# Patient Record
Sex: Female | Born: 1985 | Race: Asian | Hispanic: No | Marital: Married | Smoking: Never smoker
Health system: Southern US, Community
[De-identification: ages and names within clinical notes are randomized; demographics above are authoritative.]

## PROBLEM LIST (undated history)

## (undated) DIAGNOSIS — E039 Hypothyroidism, unspecified: Secondary | ICD-10-CM

## (undated) DIAGNOSIS — D649 Anemia, unspecified: Secondary | ICD-10-CM

## (undated) HISTORY — DX: Hypothyroidism, unspecified: E03.9

## (undated) HISTORY — DX: Anemia, unspecified: D64.9

---

## 2018-08-05 LAB — OB RESULTS CONSOLE ABO/RH: RH Type: POSITIVE

## 2018-08-05 LAB — OB RESULTS CONSOLE ANTIBODY SCREEN: Antibody Screen: NEGATIVE

## 2018-08-05 LAB — OB RESULTS CONSOLE HIV ANTIBODY (ROUTINE TESTING): HIV: NONREACTIVE

## 2018-08-05 LAB — OB RESULTS CONSOLE RUBELLA ANTIBODY, IGM: Rubella: IMMUNE

## 2018-08-05 LAB — OB RESULTS CONSOLE HEPATITIS B SURFACE ANTIGEN: Hepatitis B Surface Ag: NEGATIVE

## 2018-08-05 LAB — OB RESULTS CONSOLE RPR: RPR: NONREACTIVE

## 2018-08-13 ENCOUNTER — Other Ambulatory Visit (HOSPITAL_COMMUNITY): Payer: Self-pay

## 2018-08-13 ENCOUNTER — Ambulatory Visit (HOSPITAL_COMMUNITY)
Admission: RE | Admit: 2018-08-13 | Discharge: 2018-08-13 | Disposition: A | Discharge status: Home / Self Care | Payer: Managed Care, Other (non HMO) | Source: Ambulatory Visit | Attending: Obstetrics & Gynecology | Admitting: Obstetrics & Gynecology

## 2018-08-13 ENCOUNTER — Other Ambulatory Visit (HOSPITAL_COMMUNITY): Payer: Self-pay | Admitting: Obstetrics & Gynecology

## 2018-08-13 DIAGNOSIS — Z3A08 8 weeks gestation of pregnancy: Secondary | ICD-10-CM | POA: Diagnosis not present

## 2018-08-13 DIAGNOSIS — O3680X0 Pregnancy with inconclusive fetal viability, not applicable or unspecified: Secondary | ICD-10-CM

## 2018-08-26 NOTE — L&D Delivery Note (Signed)
Delivery Note At 7:56 AM a viable female was delivered via Vaginal, Spontaneous (Presentation: ROA  ).  APGAR: 9, 9; weight pending .   Placenta status: L&D .  Cord:  with the following complications: none .  Cord pH: n/a  Anesthesia:  epidural Episiotomy: None Lacerations: 2nd degree Suture Repair: 2.0 3.0 chromic Est. Blood Loss (mL):  100cc  Mom to postpartum.  Baby to Couplet care / Skin to Skin.  Annalee Genta 03/10/2019, 8:19 AM

## 2019-03-08 ENCOUNTER — Telehealth (HOSPITAL_COMMUNITY): Payer: Self-pay | Admitting: *Deleted

## 2019-03-08 NOTE — Telephone Encounter (Signed)
Preadmission screen  

## 2019-03-09 ENCOUNTER — Encounter (HOSPITAL_COMMUNITY): Payer: Self-pay | Admitting: *Deleted

## 2019-03-09 LAB — OB RESULTS CONSOLE GBS: GBS: NEGATIVE

## 2019-03-10 ENCOUNTER — Inpatient Hospital Stay (HOSPITAL_COMMUNITY)
Admission: AD | Admit: 2019-03-10 | Discharge: 2019-03-12 | DRG: 807 | Disposition: A | Discharge status: Home / Self Care | Payer: Managed Care, Other (non HMO) | Attending: Obstetrics and Gynecology | Admitting: Obstetrics and Gynecology

## 2019-03-10 ENCOUNTER — Other Ambulatory Visit: Payer: Self-pay

## 2019-03-10 ENCOUNTER — Inpatient Hospital Stay (HOSPITAL_COMMUNITY): Payer: Managed Care, Other (non HMO) | Admitting: Anesthesiology

## 2019-03-10 ENCOUNTER — Encounter (HOSPITAL_COMMUNITY): Payer: Self-pay

## 2019-03-10 DIAGNOSIS — E039 Hypothyroidism, unspecified: Secondary | ICD-10-CM | POA: Diagnosis present

## 2019-03-10 DIAGNOSIS — D509 Iron deficiency anemia, unspecified: Secondary | ICD-10-CM | POA: Diagnosis present

## 2019-03-10 DIAGNOSIS — O9902 Anemia complicating childbirth: Secondary | ICD-10-CM | POA: Diagnosis present

## 2019-03-10 DIAGNOSIS — Z3A39 39 weeks gestation of pregnancy: Secondary | ICD-10-CM

## 2019-03-10 DIAGNOSIS — Z1159 Encounter for screening for other viral diseases: Secondary | ICD-10-CM | POA: Diagnosis not present

## 2019-03-10 DIAGNOSIS — O26893 Other specified pregnancy related conditions, third trimester: Secondary | ICD-10-CM | POA: Diagnosis present

## 2019-03-10 DIAGNOSIS — O99284 Endocrine, nutritional and metabolic diseases complicating childbirth: Principal | ICD-10-CM | POA: Diagnosis present

## 2019-03-10 LAB — CBC
HCT: 39.1 % (ref 36.0–46.0)
Hemoglobin: 12.4 g/dL (ref 12.0–15.0)
MCH: 25.9 pg — ABNORMAL LOW (ref 26.0–34.0)
MCHC: 31.7 g/dL (ref 30.0–36.0)
MCV: 81.8 fL (ref 80.0–100.0)
Platelets: 204 10*3/uL (ref 150–400)
RBC: 4.78 MIL/uL (ref 3.87–5.11)
RDW: 14.8 % (ref 11.5–15.5)
WBC: 11.4 10*3/uL — ABNORMAL HIGH (ref 4.0–10.5)
nRBC: 0 % (ref 0.0–0.2)

## 2019-03-10 LAB — TYPE AND SCREEN
ABO/RH(D): B POS
Antibody Screen: NEGATIVE

## 2019-03-10 LAB — ABO/RH: ABO/RH(D): B POS

## 2019-03-10 LAB — RPR: RPR Ser Ql: NONREACTIVE

## 2019-03-10 LAB — SARS CORONAVIRUS 2 BY RT PCR (HOSPITAL ORDER, PERFORMED IN ~~LOC~~ HOSPITAL LAB): SARS Coronavirus 2: NEGATIVE

## 2019-03-10 MED ORDER — SODIUM CHLORIDE (PF) 0.9 % IJ SOLN
INTRAMUSCULAR | Status: DC | PRN
  Administered 2019-03-10: 12 mL/h via EPIDURAL

## 2019-03-10 MED ORDER — EPHEDRINE 5 MG/ML INJ: 10.0000 mg | INTRAVENOUS | Status: DC | PRN

## 2019-03-10 MED ORDER — ZOLPIDEM TARTRATE 5 MG PO TABS: 5.0000 mg | ORAL_TABLET | Freq: Every evening | ORAL | Status: DC | PRN

## 2019-03-10 MED ORDER — LACTATED RINGERS IV SOLN
INTRAVENOUS | Status: DC
  Administered 2019-03-10 (×2): via INTRAVENOUS

## 2019-03-10 MED ORDER — ONDANSETRON HCL 4 MG PO TABS: 4.0000 mg | ORAL_TABLET | ORAL | Status: DC | PRN

## 2019-03-10 MED ORDER — LEVOTHYROXINE SODIUM 50 MCG PO TABS
50.0000 ug | ORAL_TABLET | Freq: Every day | ORAL | Status: DC
  Administered 2019-03-10 – 2019-03-11 (×2): 50 ug via ORAL
  Filled 2019-03-10 (×3): qty 1

## 2019-03-10 MED ORDER — FLEET ENEMA 7-19 GM/118ML RE ENEM: 1.0000 | ENEMA | RECTAL | Status: DC | PRN

## 2019-03-10 MED ORDER — SOD CITRATE-CITRIC ACID 500-334 MG/5ML PO SOLN: 30.0000 mL | ORAL | Status: DC | PRN

## 2019-03-10 MED ORDER — LACTATED RINGERS IV SOLN
500.0000 mL | Freq: Once | INTRAVENOUS | Status: AC
  Administered 2019-03-10: 500 mL via INTRAVENOUS

## 2019-03-10 MED ORDER — IBUPROFEN 600 MG PO TABS
600.0000 mg | ORAL_TABLET | Freq: Four times a day (QID) | ORAL | Status: DC
  Filled 2019-03-10 (×5): qty 1

## 2019-03-10 MED ORDER — OXYTOCIN BOLUS FROM INFUSION
500.0000 mL | Freq: Once | INTRAVENOUS | Status: AC
  Administered 2019-03-10: 500 mL via INTRAVENOUS

## 2019-03-10 MED ORDER — LIDOCAINE HCL (PF) 1 % IJ SOLN
30.0000 mL | INTRAMUSCULAR | Status: AC | PRN
  Administered 2019-03-10: 30 mL via SUBCUTANEOUS
  Filled 2019-03-10: qty 30

## 2019-03-10 MED ORDER — ACETAMINOPHEN 325 MG PO TABS: 650.0000 mg | ORAL_TABLET | ORAL | Status: DC | PRN

## 2019-03-10 MED ORDER — OXYTOCIN 40 UNITS IN NORMAL SALINE INFUSION - SIMPLE MED
2.5000 [IU]/h | INTRAVENOUS | Status: DC
  Filled 2019-03-10: qty 1000

## 2019-03-10 MED ORDER — DIPHENHYDRAMINE HCL 25 MG PO CAPS: 25.0000 mg | ORAL_CAPSULE | Freq: Four times a day (QID) | ORAL | Status: DC | PRN

## 2019-03-10 MED ORDER — PRENATAL MULTIVITAMIN CH
1.0000 | ORAL_TABLET | Freq: Every day | ORAL | Status: DC
  Administered 2019-03-10 – 2019-03-12 (×3): 1 via ORAL
  Filled 2019-03-10 (×3): qty 1

## 2019-03-10 MED ORDER — ONDANSETRON HCL 4 MG/2ML IJ SOLN: 4.0000 mg | INTRAMUSCULAR | Status: DC | PRN

## 2019-03-10 MED ORDER — OXYCODONE-ACETAMINOPHEN 5-325 MG PO TABS: 1.0000 | ORAL_TABLET | ORAL | Status: DC | PRN

## 2019-03-10 MED ORDER — DIPHENHYDRAMINE HCL 50 MG/ML IJ SOLN: 12.5000 mg | INTRAMUSCULAR | Status: DC | PRN

## 2019-03-10 MED ORDER — PHENYLEPHRINE 40 MCG/ML (10ML) SYRINGE FOR IV PUSH (FOR BLOOD PRESSURE SUPPORT)
PREFILLED_SYRINGE | INTRAVENOUS | Status: AC
  Filled 2019-03-10: qty 10

## 2019-03-10 MED ORDER — BENZOCAINE-MENTHOL 20-0.5 % EX AERO
1.0000 "application " | INHALATION_SPRAY | CUTANEOUS | Status: DC | PRN
  Administered 2019-03-10: 1 via TOPICAL
  Filled 2019-03-10: qty 56

## 2019-03-10 MED ORDER — FENTANYL-BUPIVACAINE-NACL 0.5-0.125-0.9 MG/250ML-% EP SOLN: 12.0000 mL/h | EPIDURAL | Status: DC | PRN

## 2019-03-10 MED ORDER — OXYCODONE-ACETAMINOPHEN 5-325 MG PO TABS: 2.0000 | ORAL_TABLET | ORAL | Status: DC | PRN

## 2019-03-10 MED ORDER — SENNOSIDES-DOCUSATE SODIUM 8.6-50 MG PO TABS
2.0000 | ORAL_TABLET | ORAL | Status: DC
  Filled 2019-03-10: qty 2

## 2019-03-10 MED ORDER — DIBUCAINE (PERIANAL) 1 % EX OINT: 1.0000 "application " | TOPICAL_OINTMENT | CUTANEOUS | Status: DC | PRN

## 2019-03-10 MED ORDER — COCONUT OIL OIL: 1.0000 "application " | TOPICAL_OIL | Status: DC | PRN

## 2019-03-10 MED ORDER — FENTANYL-BUPIVACAINE-NACL 0.5-0.125-0.9 MG/250ML-% EP SOLN
EPIDURAL | Status: AC
  Filled 2019-03-10: qty 250

## 2019-03-10 MED ORDER — WITCH HAZEL-GLYCERIN EX PADS: 1.0000 "application " | MEDICATED_PAD | CUTANEOUS | Status: DC | PRN

## 2019-03-10 MED ORDER — PHENYLEPHRINE 40 MCG/ML (10ML) SYRINGE FOR IV PUSH (FOR BLOOD PRESSURE SUPPORT): 80.0000 ug | PREFILLED_SYRINGE | INTRAVENOUS | Status: DC | PRN

## 2019-03-10 MED ORDER — ONDANSETRON HCL 4 MG/2ML IJ SOLN: 4.0000 mg | Freq: Four times a day (QID) | INTRAMUSCULAR | Status: DC | PRN

## 2019-03-10 MED ORDER — SIMETHICONE 80 MG PO CHEW: 80.0000 mg | CHEWABLE_TABLET | ORAL | Status: DC | PRN

## 2019-03-10 MED ORDER — LIDOCAINE HCL (PF) 1 % IJ SOLN
INTRAMUSCULAR | Status: DC | PRN
  Administered 2019-03-10: 5 mL via EPIDURAL

## 2019-03-10 MED ORDER — LACTATED RINGERS IV SOLN: 500.0000 mL | INTRAVENOUS | Status: DC | PRN

## 2019-03-10 NOTE — Anesthesia Preprocedure Evaluation (Signed)
Anesthesia Evaluation  Patient identified by MRN, date of birth, ID band Patient awake    Reviewed: Allergy & Precautions, NPO status , Patient's Chart, lab work & pertinent test results  Airway Mallampati: II  TM Distance: >3 FB Neck ROM: Full    Dental no notable dental hx.    Pulmonary neg pulmonary ROS,    Pulmonary exam normal breath sounds clear to auscultation       Cardiovascular negative cardio ROS Normal cardiovascular exam Rhythm:Regular Rate:Normal     Neuro/Psych negative neurological ROS  negative psych ROS   GI/Hepatic   Endo/Other  Hypothyroidism   Renal/GU      Musculoskeletal   Abdominal   Peds  Hematology Hgb 12.4 Plt 204   Anesthesia Other Findings   Reproductive/Obstetrics (+) Pregnancy                             Anesthesia Physical Anesthesia Plan  ASA: II  Anesthesia Plan: Epidural   Post-op Pain Management:    Induction:   PONV Risk Score and Plan:   Airway Management Planned:   Additional Equipment:   Intra-op Plan:   Post-operative Plan:   Informed Consent: I have reviewed the patients History and Physical, chart, labs and discussed the procedure including the risks, benefits and alternatives for the proposed anesthesia with the patient or authorized representative who has indicated his/her understanding and acceptance.       Plan Discussed with:   Anesthesia Plan Comments:         Anesthesia Quick Evaluation

## 2019-03-10 NOTE — MAU Note (Signed)
CTX every 5 minutes. No LOF.  Some light spotting.  + FM.  3 cm on last exam.

## 2019-03-10 NOTE — Anesthesia Procedure Notes (Signed)

## 2019-03-10 NOTE — Anesthesia Postprocedure Evaluation (Signed)
Anesthesia Post Note  Patient: Kelsey Walker  Procedure(s) Performed: AN AD [place]     Patient location during evaluation: Mother Baby Anesthesia Type: Epidural Level of consciousness: awake, awake and alert and oriented Pain management: pain level controlled Vital Signs Assessment: post-procedure vital signs reviewed and stable Respiratory status: spontaneous breathing and respiratory function stable Cardiovascular status: blood pressure returned to baseline Postop Assessment: no headache, epidural receding, patient able to bend at knees, adequate PO intake, no backache, no apparent nausea or vomiting and able to ambulate Anesthetic complications: no    Last Vitals:  Vitals:   03/10/19 1015 03/10/19 1100  BP: 107/68 109/67  Pulse: 75 68  Resp: 17 17  Temp: 36.8 C 36.5 C  SpO2:      Last Pain:  Vitals:   03/10/19 1100  TempSrc: Oral  PainSc: 7    Pain Goal:                   Bufford Spikes

## 2019-03-10 NOTE — H&P (Signed)
HPI: 33 y/o G2P1001 @ [redacted]w[redacted]d estimated gestational age (as dated by LMP c/w 20 week ultrasound) presents for painful contractions.   no Leaking of Fluid,   no Vaginal Bleeding,   + Uterine Contractions,  + Fetal Movement.  Prenatal care has been provided by Dr. Nelda Marseille  ROS: no HA, no epigastric pain, no visual changes.    Pregnancy complicated by: 1) Hypothyroidism- on Levothyroxine 2mcg daily 2) Iron def.- on oral iron tablets   Prenatal Transfer Tool  Maternal Diabetes: No Genetic Screening: Normal Maternal Ultrasounds/Referrals: Normal Fetal Ultrasounds or other Referrals:  None Maternal Substance Abuse:  No Significant Maternal Medications:  Meds include: Syntroid Significant Maternal Lab Results: Group B Strep negative   PNL:  GBS negative, Rub Immune, Hep B neg, RPR NR, HIV neg, GC/C neg, glucola:normal Hgb: 11.2 Blood type: B positive, antibody neg  Immunizations: Tdap: 5/15 Flu: 12/19  OBHx: FTNSVD x 1, 1#5AX, uncomplicated PMHx:  Hypothyroidism, iron def. Meds:  PNV, iron, synthroid 2mcg daily Allergy:  No Known Allergies SurgHx: none SocHx:   no Tobacco, no  EtOH, no Illicit Drugs  O: BP 094/07   Pulse 76   Temp 97.6 F (36.4 C) (Oral)   Resp 20   Ht 5\' 1"  (1.549 m)   Wt 74.1 kg   LMP 06/05/2018   SpO2 100%   BMI 30.88 kg/m  Gen. AAOx3, NAD CV.  RRR  No murmur.  Resp. CTAB, no wheeze or crackles. Abd. Gravid,  no tenderness,  no rigidity,  no guarding Extr.  no edema B/L , no calf tenderness, neg Homan's B/L  FHT: 135 baseline, mod variability, + accels,  Occasional variable decels Toco: q 4 min SVE: 9/90/-1, AROM clear fluid   Labs: see orders  A/P:  33 y.o. G2P1001 @ [redacted]w[redacted]d EGA who presents for active labor -FWB:  NICHD Cat II FHTs- pt repositioned, FHT now Cat. I -Labor: expectant management -GBS: negative -Pain management: continue epidural -Hypothyroidism: continue synthroid 51mcg daily  Janyth Pupa, DO [phone]  (cell) [phone] (office)

## 2019-03-11 LAB — CBC
HCT: 35.2 % — ABNORMAL LOW (ref 36.0–46.0)
Hemoglobin: 11.3 g/dL — ABNORMAL LOW (ref 12.0–15.0)
MCH: 25.9 pg — ABNORMAL LOW (ref 26.0–34.0)
MCHC: 32.1 g/dL (ref 30.0–36.0)
MCV: 80.7 fL (ref 80.0–100.0)
Platelets: 184 10*3/uL (ref 150–400)
RBC: 4.36 MIL/uL (ref 3.87–5.11)
RDW: 14.7 % (ref 11.5–15.5)
WBC: 12.7 10*3/uL — ABNORMAL HIGH (ref 4.0–10.5)
nRBC: 0 % (ref 0.0–0.2)

## 2019-03-11 NOTE — Lactation Note (Signed)
This note was copied from a baby's chart. Lactation Consultation Note Baby 72 hrs old. Mom states baby hasn't been very interested in BF a lot. Newborn behavior, STS, I&O, breast massage, supply and demand. Mom has Large everted nipples. Hand expression demonstrated colostrum. Mom has a 34 yr old that she exclusively pumped and bottle fed for 1 yr. Mom states that she hopes just to BF for this baby. Asked mom to call for assistance when baby is more interested in feeding. LC wonders if baby is able to get mom's large nipple in her mouth well. Lactation brochure given   Patient Name: Kelsey Walker [id number]'[id number] Date: 03/11/2019 Reason for consult: Initial assessment;Term   Maternal Data Has patient been taught Hand Expression?: Yes Does the patient have breastfeeding experience prior to this delivery?: Yes  Feeding Feeding Type: Breast Fed  LATCH Score Latch: Repeated attempts needed to sustain latch, nipple held in mouth throughout feeding, stimulation needed to elicit sucking reflex.  Audible Swallowing: A few with stimulation  Type of Nipple: Everted at rest and after stimulation  Comfort (Breast/Nipple): Soft / non-tender  Hold (Positioning): Assistance needed to correctly position infant at breast and maintain latch.  LATCH Score: 7  Interventions Interventions: Breast feeding basics reviewed;Hand express;Breast massage  Lactation Tools Discussed/Used     Consult Status Consult Status: Follow-up Date: 03/11/19 Follow-up type: In-patient    Theodoro Kalata 03/11/2019, 1:37 AM

## 2019-03-11 NOTE — Lactation Note (Signed)
This note was copied from a baby's chart. Lactation Consultation Note  Patient Name: Kelsey Walker [id number] Date: 03/11/2019 Reason for consult: Follow-up assessment;1st time breastfeeding;Term;Infant weight loss Baby is 27 hours old / @ 22 hours Bili check 6.1  LC reviewed and updated the doc flow sheets / WNL for age  As LC entered the room baby latched on the left breast / football/ shallow latch/ and LC  Noted baby to be non-nutritive. LC with dad's help switched moms pillow to being vertical and added support Under baby and it allowed baby's mouth to open wider. Increased swallows noted and increased more with  Compressions. Baby fed for 30 mins and was very relaxed and not sucking, LC reviewed with mom how to  Take the baby off, nipple well rounded. Baby woke up, [place] offered to assist to switch baby to the right breast  Football, and reviewed basics of latching and the importance of depth at the breast. Multiple swallows noted / increased  With compressions. Baby satisfied after feeding.  Dad showed LC the large heavy wet diaper and transitional stool   Per mom feels  both breast are filling and showed LC a small fill nodule.  LC reminded mom when the baby is feeding important to massage and the knot should disappear.  LC reviewed sore nipple and engorgement prevention and tx. Mom denies sore nipples.  Per mom had engorgement issues with her 1st baby - just pumping and bottle feeding.  Mom brought her hand pump - HAAKA to the hospital and [facility] reviewed it with her.  Mom also mentioned she has a DEBP Med[place] at home.  Her F/U Pedis  - is Psychologist, forensic ( formerly [employer] ).  LC highly recommended since she just pumped for 1 year with her 1st baby to F/U with [place] in [place] office or consider F/U at [facility] if she is not available next week.  Mom and dad asked many basic breast feeding questions and will need F/U tomorrow for BF reassurance.   Maternal  Data Has patient been taught Hand Expression?: Yes  Feeding Feeding Type: Breast Fed  LATCH Score Latch: Grasps breast easily, tongue down, lips flanged, rhythmical sucking.  Audible Swallowing: Spontaneous and intermittent  Type of Nipple: Everted at rest and after stimulation  Comfort (Breast/Nipple): Soft / non-tender  Hold (Positioning): Assistance needed to correctly position infant at breast and maintain latch.  LATCH Score: 9  Interventions Interventions: Breast feeding basics reviewed;Assisted with latch;Skin to skin;Breast massage;Hand express;Breast compression;Adjust position;Support pillows;Position options  Lactation Tools Discussed/Used WIC Program: No Pump Review: Milk Storage   Consult Status Consult Status: Follow-up Date: 03/12/19 Follow-up type: In-patient    [place] 03/11/2019, 5:02 PM

## 2019-03-11 NOTE — Progress Notes (Signed)
Postpartum Note Day # 1  S:  Patient resting comfortable in bed.  Notes some pain with breastfeeding- pt states she is not taking pain medication  Tolerating general diet. + flatus, + BM.  Lochia moderate.  Ambulating without difficulty.  She denies n/v/f/c, SOB, or CP.  Pt plans on breastfeeding.  O: Temp:  [97.5 F (36.4 C)-98.3 F (36.8 C)] 98.3 F (36.8 C) (07/16 0552) Pulse Rate:  [65-103] 71 (07/16 0552) Resp:  [16-18] 18 (07/15 1926) BP: (102-127)/(59-84) 123/84 (07/16 0552) SpO2:  [99 %] 99 % (07/15 1926) Gen: A&Ox3, NAD Resp: Normal respiratory rate and effort Abdomen: soft, NT, ND Uterus: firm, non-tender, below umbilicus Ext: No edema, no calf tenderness bilaterally Labs:  Recent Labs    03/10/19 0505 03/11/19 0538  HGB 12.4 11.3*    A/P: Pt is a 33 y.o. [id number] s/p NSVD, PPD#1  - Pain well controlled -GU: voiding freely -GI: Tolerating general diet -Activity: encouraged sitting up to chair and ambulation as tolerated -Prophylaxis: early ambulation -Labs: stable as above  Continue with postpartum care, plan for discharge home, PPD#2  Janyth Pupa, DO [phone] (cell) [phone] (office)

## 2019-03-12 MED ORDER — IBUPROFEN 600 MG PO TABS: 600.0000 mg | ORAL_TABLET | Freq: Four times a day (QID) | ORAL | 0 refills | Status: AC

## 2019-03-12 NOTE — Discharge Summary (Signed)
OB Discharge Summary     Patient Name: Kelsey Walker DOB: 1986-05-09 MRN: [id number]  Date of admission: 03/10/2019 Delivering MD: Janyth Pupa   Date of discharge: 03/12/2019  Admitting diagnosis: 40WKS CTX Intrauterine pregnancy: [redacted]w[redacted]d     Secondary diagnosis:  Active Problems:   Normal labor  Additional problems: hypothyroidism     Discharge diagnosis: Term Pregnancy Delivered                                                                                                Post partum procedures:none  Augmentation: AROM  Complications: None  Hospital course:  Onset of Labor With Vaginal Delivery     33 y.o. yo G2P1001 at [redacted]w[redacted]d was admitted in Active Labor on 03/10/2019. Patient had an uncomplicated labor course as follows:  Membrane Rupture Time/Date: 7:25 AM ,03/10/2019   Intrapartum Procedures: Episiotomy: None [1]                                         Lacerations:  2nd degree [3]  Patient had a delivery of a Viable infant. 03/10/2019  Information for the patient's newborn:  Chantele, [place] Girl Sabel [[id number]]  Delivery Method: Vag-Spont     Pateint had an uncomplicated postpartum course.  She is ambulating, tolerating a regular diet, passing flatus, and urinating well. Patient is discharged home in stable condition on 03/12/19.   Physical exam  Vitals:   03/10/19 1926 03/11/19 0552 03/11/19 1721 03/11/19 2218  BP: 102/70 123/84 116/75 113/75  Pulse: 77 71 78 75  Resp: 18  17 18   Temp: 98.2 F (36.8 C) 98.3 F (36.8 C) 98.2 F (36.8 C) 97.9 F (36.6 C)  TempSrc: Oral  Oral Oral  SpO2: 99%   100%  Weight:      Height:       General: alert, cooperative and no distress Lochia: appropriate Uterine Fundus: firm Incision: N/A DVT Evaluation: No evidence of DVT seen on physical exam. Labs: Lab Results  Component Value Date   WBC 12.7 (H) 03/11/2019   HGB 11.3 (L) 03/11/2019   HCT 35.2 (L) 03/11/2019   MCV 80.7 03/11/2019   PLT 184 03/11/2019   No  flowsheet data found.  Discharge instruction: per After Visit Summary and "Baby and Me Booklet".  After visit meds:  Allergies as of 03/12/2019   No Known Allergies     Medication List    TAKE these medications   ferrous sulfate 325 (65 FE) MG tablet Take 325 mg by mouth daily with breakfast.   ibuprofen 600 MG tablet Commonly known as: ADVIL Take 1 tablet (600 mg total) by mouth every 6 (six) hours.   levothyroxine 50 MCG tablet Commonly known as: SYNTHROID Take 50 mcg by mouth daily before breakfast.   prenatal multivitamin Tabs tablet Take 1 tablet by mouth daily at 12 noon.       Diet: routine diet  Activity: Advance as tolerated. Pelvic rest for 6 weeks.   Outpatient follow up:6 weeks Follow  up Appt:No future appointments. Follow up Visit:No follow-ups on file.  Postpartum contraception: Undecided  Newborn Data: Live born female  Birth Weight: 6 lb 10.7 oz (3025 g) APGAR: 9, 9  Newborn Delivery   Birth date/time: 03/10/2019 07:56:00 Delivery type: Vaginal, Spontaneous      Baby Feeding: Breast Disposition:home with mother   03/12/2019 Sharon SellerJennifer M Ryli Standlee, DO

## 2019-03-12 NOTE — Discharge Instructions (Signed)

## 2019-03-12 NOTE — Lactation Note (Signed)
This note was copied from a baby's chart. Lactation Consultation Note  Patient Name: Kelsey Walker [id number] Date: 03/12/2019 Reason for consult: Follow-up assessment;Infant weight loss;Nipple pain/trauma Baby is 65 hours old / 7 % weight loss Bili - 9.3 at 45 hours As LC entered the room baby asleep and per mom baby last fed at 8 am for 20 mins.  While LC reviewing breast feeding D/C information baby woke up- and [place] watch mom latch the  Baby and she was allowing the baby to nibble her way on to the breast.  LC had mom take her off, and start over to latch.LC assisted to obtain depth and assisted mom  How to position her hands and breast compressions with latch. LC encouraged and recommended to mom Prior to latch - breast massage, hand express, pre- pump with her HAAKa due to soreness, and latch with breast  Compressions, reverse pressure as shown if needed.  Reviewed sore nipples and engorgement prevention and tx.  Per mom has a [place] at home.  Stressed STS feedings until baby is back to birth weight and gaining steadily, and can stay awake for majority  Of feeding.  LC highly recommended again today to mom and dad to call their [place] office and set up a [place] appt with Irine Seal, [place]  For next week for reassurance. Mom did not breast feed 1st, only pumped and bottle fed.      Maternal Data    Feeding Feeding Type: Breast Fed  LATCH Score Latch: Grasps breast easily, tongue down, lips flanged, rhythmical sucking.  Audible Swallowing: Spontaneous and intermittent  Type of Nipple: Everted at rest and after stimulation  Comfort (Breast/Nipple): Filling, red/small blisters or bruises, mild/mod discomfort  Hold (Positioning): Assistance needed to correctly position infant at breast and maintain latch.  LATCH Score: 8  Interventions Interventions: Breast feeding basics reviewed;Assisted with latch;Skin to skin;Breast massage;Hand express;Breast compression;Adjust  position;Support pillows;Position options  Lactation Tools Discussed/Used Tools: Shells;Comfort gels Shell Type: Inverted   Consult Status Consult Status: Follow-up Date: ([place] recommended to call Irine Seal and make an [place] appt for next week Monday or Tuesday) Follow-up type: [place] 03/12/2019, 10:14 AM

## 2019-03-16 ENCOUNTER — Other Ambulatory Visit (HOSPITAL_COMMUNITY)
Admission: RE | Admit: 2019-03-16 | Discharge: 2019-03-16 | Disposition: A | Discharge status: Home / Self Care | Payer: Managed Care, Other (non HMO) | Source: Ambulatory Visit

## 2019-03-18 ENCOUNTER — Inpatient Hospital Stay (HOSPITAL_COMMUNITY): Payer: Managed Care, Other (non HMO)

## 2019-03-18 ENCOUNTER — Inpatient Hospital Stay (HOSPITAL_COMMUNITY)
Admission: AD | Admit: 2019-03-18 | Payer: Managed Care, Other (non HMO) | Source: Home / Self Care | Admitting: Obstetrics & Gynecology

## 2019-09-04 IMAGING — US US OB TRANSVAGINAL
1 series · 15 of 28 positions shown · non-contrast
Comparison: None.

CLINICAL DATA: First trimester pregnancy with inconclusive fetal
viability. No detectable fetal heart tones. Gestational age by LMP
of 9 weeks 6 days.

EXAM:
TRANSVAGINAL OB ULTRASOUND
TECHNIQUE: Transvaginal ultrasound was performed for complete evaluation of the
gestation as well as the maternal uterus, adnexal regions, and
pelvic cul-de-sac.

[Series 1: us ob transvaginal · 15 of 49 slices shown]
[im 1/49]
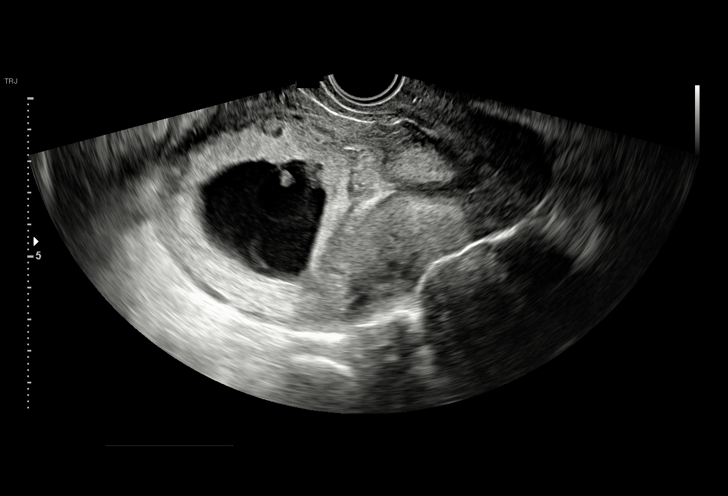
[im 4/49]
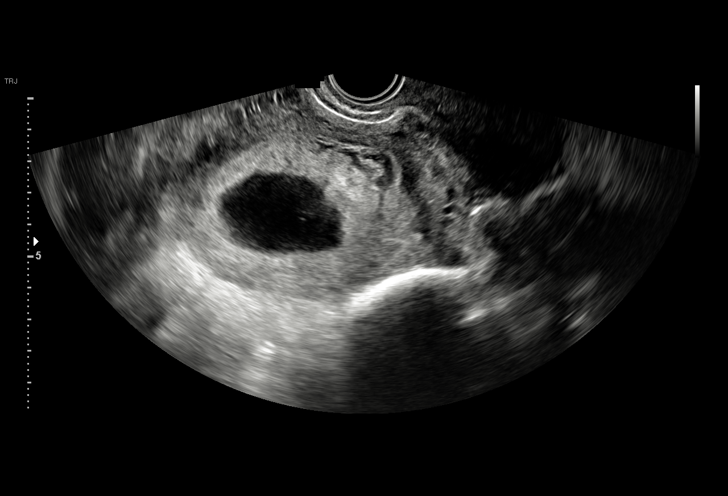
[im 8/49]
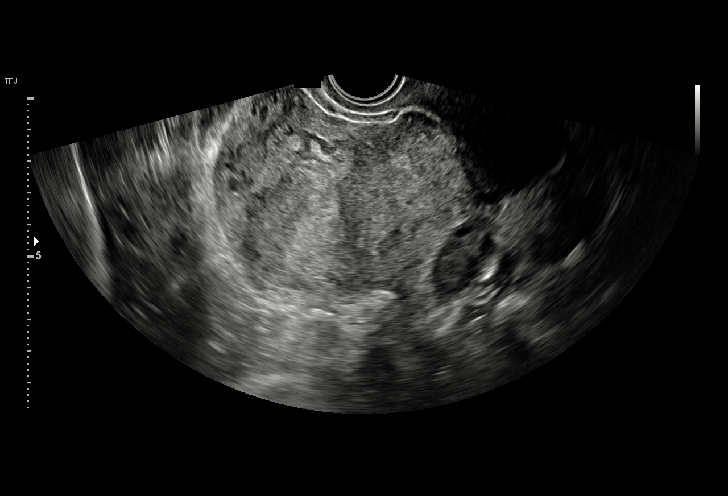
[im 11/49]
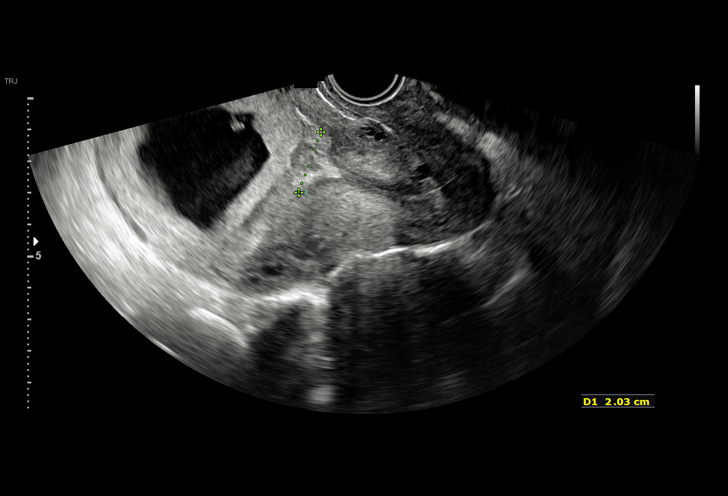
[im 15/49]
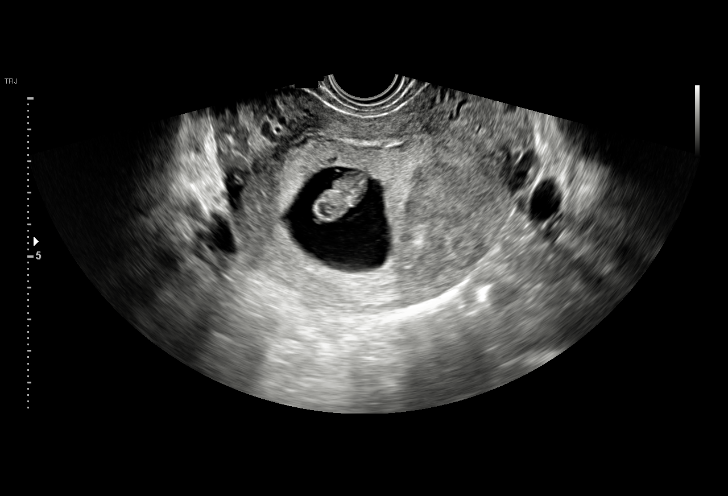
[im 18/49]
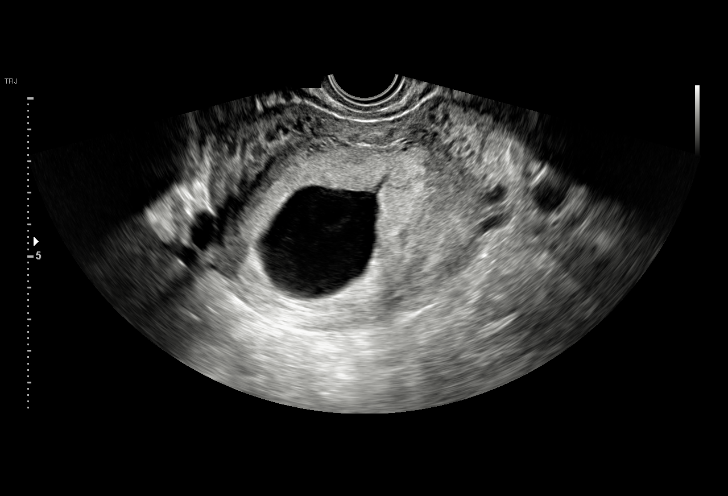
[im 22/49]
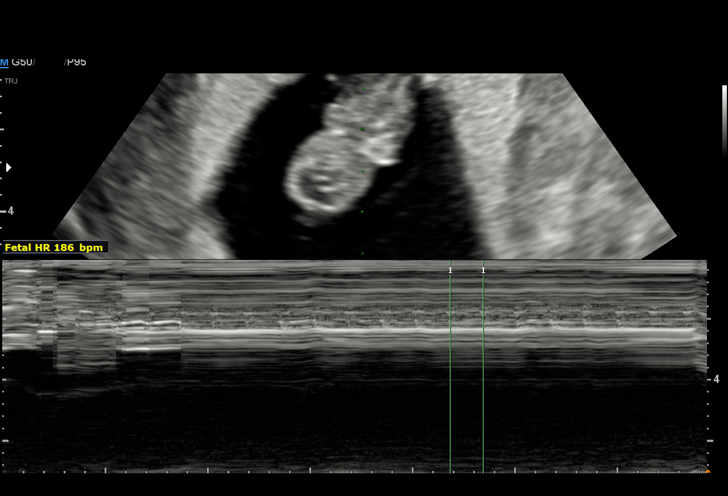
[im 25/49]
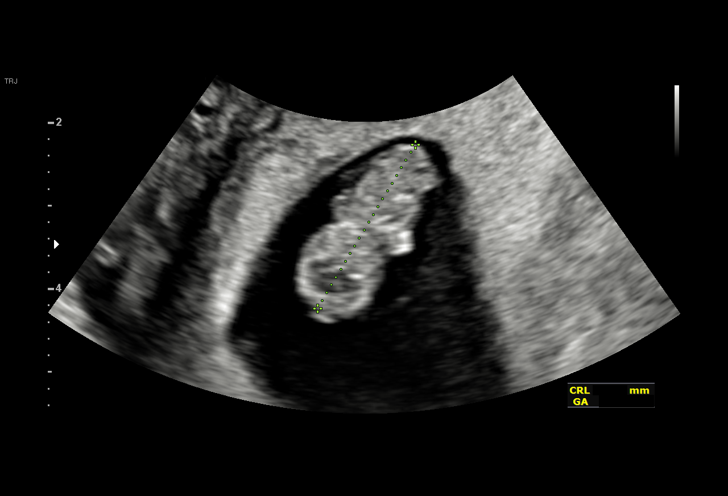
[im 27/49]
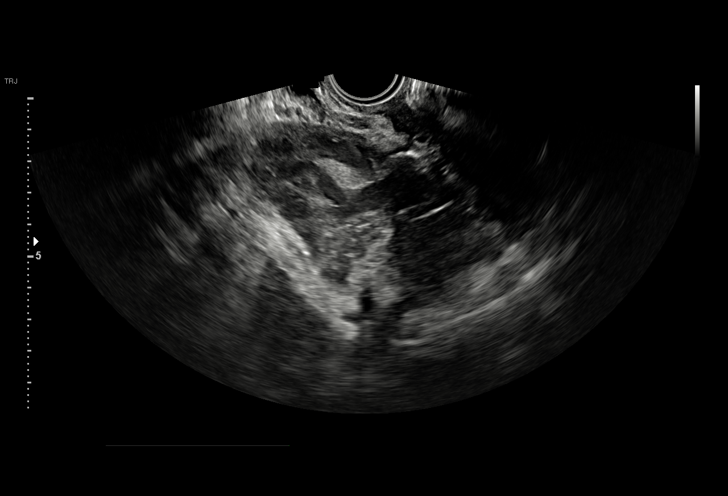
[im 31/49]
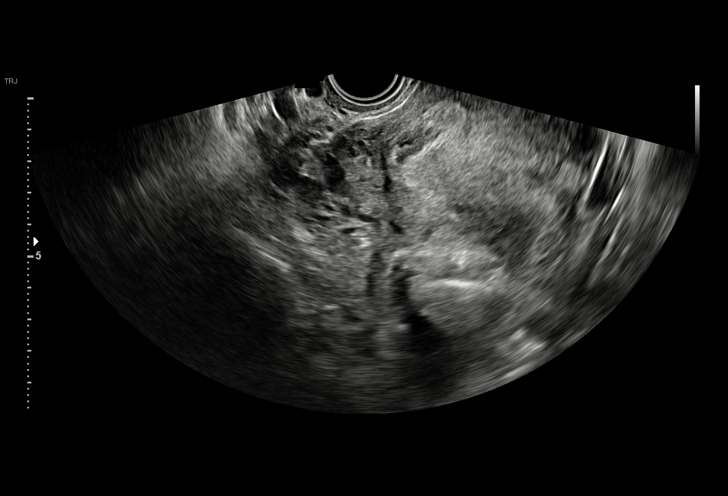
[im 34/49]
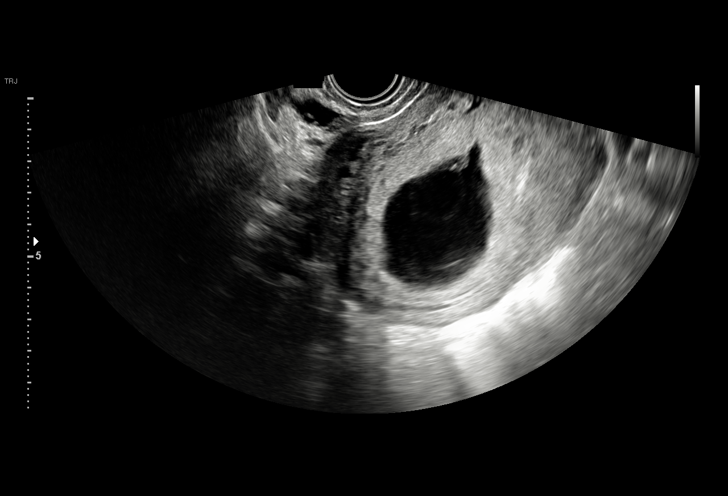
[im 38/49]
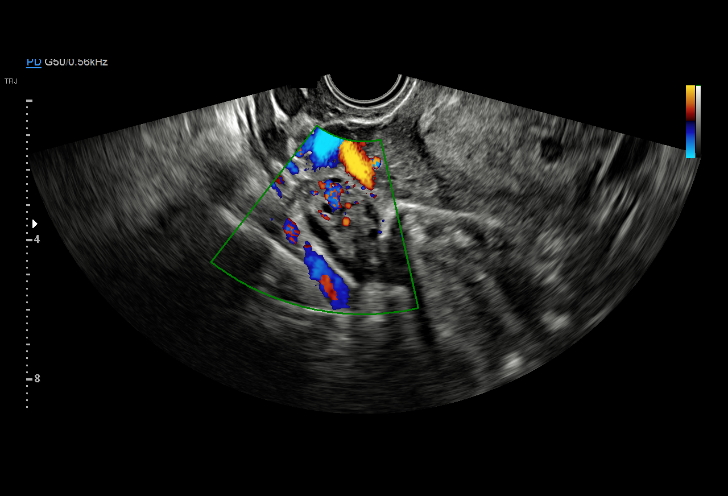
[im 41/49]
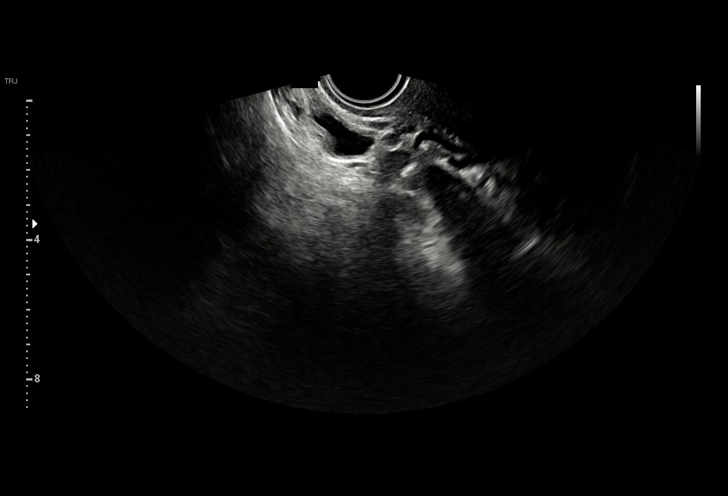
[im 45/49]
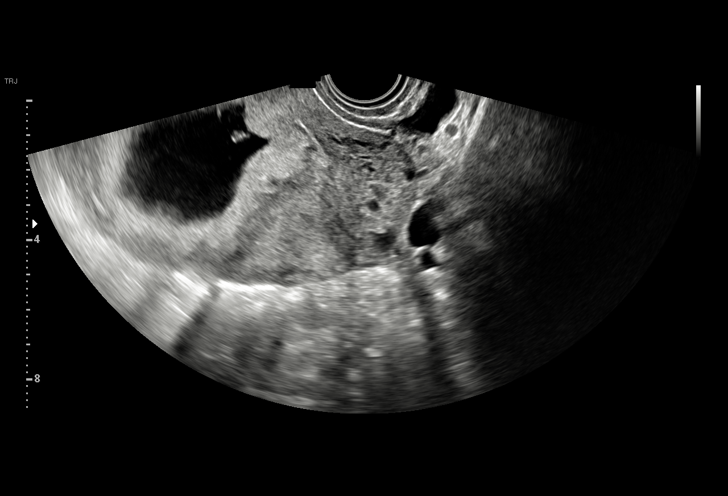
[im 49/49]
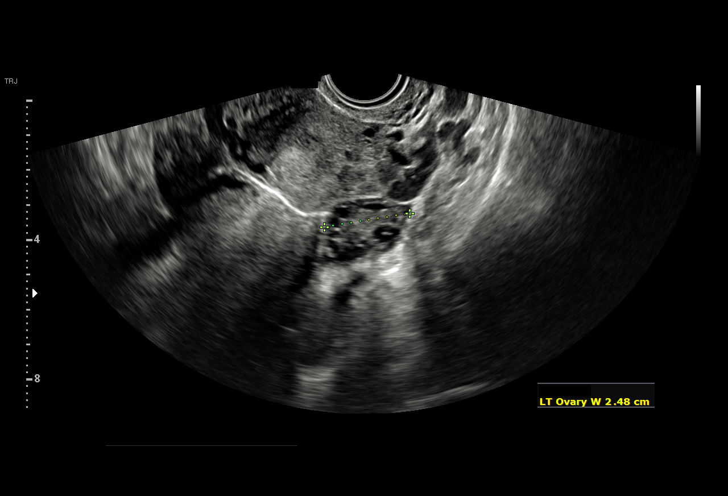

[15 of 28 positions shown; findings below may reference images not displayed]

FINDINGS: Intrauterine gestational sac: Single

Yolk sac:  Visualized.

Embryo:  Visualized.

Cardiac Activity: Visualized.

Heart Rate: 186 bpm

CRL:   23 mm   8 w 6 d                  US EDC: 03/19/2019

Subchorionic hemorrhage:  None visualized.

Maternal uterus/adnexae: Normal appearance of both ovaries. No mass
or abnormal free fluid identified.
IMPRESSION: Single living IUP measuring 8 weeks 6 days, with US EDC of
03/19/2019.

No significant maternal uterine or adnexal abnormality identified.
